# Patient Record
Sex: Male | Born: 1960 | Race: White | Hispanic: No | Marital: Married | Smoking: Never smoker
Health system: Southern US, Community
[De-identification: ages and names within clinical notes are randomized; demographics above are authoritative.]

---

## 2004-06-01 ENCOUNTER — Ambulatory Visit (HOSPITAL_COMMUNITY): Admission: RE | Admit: 2004-06-01 | Discharge: 2004-06-01 | Payer: Self-pay | Admitting: Gastroenterology

## 2013-02-27 ENCOUNTER — Ambulatory Visit: Payer: Self-pay | Admitting: Sports Medicine

## 2013-04-23 ENCOUNTER — Other Ambulatory Visit: Payer: Self-pay | Admitting: Orthopedic Surgery

## 2013-04-23 DIAGNOSIS — M8580 Other specified disorders of bone density and structure, unspecified site: Secondary | ICD-10-CM

## 2013-05-07 ENCOUNTER — Ambulatory Visit
Admission: RE | Admit: 2013-05-07 | Discharge: 2013-05-07 | Disposition: A | Discharge status: Home / Self Care | Payer: Managed Care, Other (non HMO) | Source: Ambulatory Visit | Attending: Orthopedic Surgery | Admitting: Orthopedic Surgery

## 2013-05-07 DIAGNOSIS — M8580 Other specified disorders of bone density and structure, unspecified site: Secondary | ICD-10-CM

## 2016-11-11 ENCOUNTER — Encounter: Payer: Self-pay | Admitting: Family Medicine

## 2016-11-11 ENCOUNTER — Ambulatory Visit (INDEPENDENT_AMBULATORY_CARE_PROVIDER_SITE_OTHER): Payer: BLUE CROSS/BLUE SHIELD | Admitting: Family Medicine

## 2016-11-11 DIAGNOSIS — M722 Plantar fascial fibromatosis: Secondary | ICD-10-CM

## 2016-11-11 NOTE — Patient Instructions (Signed)
You have plantar fasciitis Take tylenol or aleve as needed for pain  Plantar fascia stretch for 20-30 seconds (do 3 of these) in morning Lowering/raise on a step exercises 3 x 10 once or twice a day - this is very important for long term recovery. Can add heel walks, toe walks forward and backward as well Ice heel for 15 minutes as needed. Avoid flat shoes/barefoot walking as much as possible. Arch binders have been shown to help with pain. Spencos or our green insoles with scaphoid pads are what I would recommend to use as much as possible the next 6 weeks. Steroid injection is a consideration for short term pain relief if you are struggling. Physical therapy is also an option. Follow up with me in 6 weeks but can call me sooner if you want to try the injection, physical therapy.

## 2016-11-14 DIAGNOSIS — M722 Plantar fascial fibromatosis: Secondary | ICD-10-CM | POA: Insufficient documentation

## 2016-11-14 NOTE — Progress Notes (Signed)
PCP: Lorenda Peck, MD  Subjective:   HPI: Patient is a 56 y.o. male here for bilateral heel pain.  Patient reports he's had about 4 weeks of plantar to posterior heel pain. Worse with running on treadmill. Pain worse on right side at 8/10 and sharp, more dull at 4/10 on left. Tried advil, ibuprofen, resting, heel pads with only mild relief. No skin changes, numbness.  No past medical history on file.  No current outpatient prescriptions on file prior to visit.   No current facility-administered medications on file prior to visit.     No past surgical history on file.  No Known Allergies  Social History   Social History  . Marital status: Married    Spouse name: N/A  . Number of children: N/A  . Years of education: N/A   Occupational History  . Not on file.   Social History Main Topics  . Smoking status: Never Smoker  . Smokeless tobacco: Never Used  . Alcohol use Not on file  . Drug use: Unknown  . Sexual activity: Not on file   Other Topics Concern  . Not on file   Social History Narrative  . No narrative on file    No family history on file.  Pulse 79   Ht  (1.854 m)   Wt 200 lb (90.7 kg)   BMI 26.39 kg/m   Review of Systems: See HPI above.     Objective:  Physical Exam:  Gen: NAD, comfortable in exam room  Bilateral feet/ankles: Mild pronation.  No gross deformity, swelling, ecchymoses FROM TTP plantar fascia bilaterally at insertion on calcaneus.  No other tenderness. Negative ant drawer and talar tilt.   Negative syndesmotic compression. Negative calcaneal squeeze. Thompsons test negative. NV intact distally.   Assessment & Plan:  1. Bilateral plantar fasciitis - worse on right.  Shown home exercises and stretches to do daily.  Arch binders, inserts reviewed also.  Icing, tylenol or motrin.  Consider physical therapy, injections if not improving.  f/u in 6 weeks.

## 2016-11-14 NOTE — Assessment & Plan Note (Signed)
worse on right.  Shown home exercises and stretches to do daily.  Arch binders, inserts reviewed also.  Icing, tylenol or motrin.  Consider physical therapy, injections if not improving.  f/u in 6 weeks.

## 2016-12-22 ENCOUNTER — Ambulatory Visit (INDEPENDENT_AMBULATORY_CARE_PROVIDER_SITE_OTHER): Payer: BLUE CROSS/BLUE SHIELD | Admitting: Family Medicine

## 2016-12-22 ENCOUNTER — Encounter: Payer: Self-pay | Admitting: Family Medicine

## 2016-12-22 DIAGNOSIS — M722 Plantar fascial fibromatosis: Secondary | ICD-10-CM

## 2016-12-22 NOTE — Patient Instructions (Signed)
You have plantar fasciitis Continue the stretches and step exercise. Can add heel walks, toe walks forward and backward as well Ice heel for 15 minutes as needed. Continue arch binders, green insoles as well. Steroid injection is a consideration for short term pain relief if you are struggling. Physical therapy is also an option. Follow up with me in 6 weeks. The inserts can be ordered from [email or web address] (small scaphoid pads, comforthotic insoles mens 10-11)

## 2016-12-23 ENCOUNTER — Ambulatory Visit: Payer: BLUE CROSS/BLUE SHIELD | Admitting: Family Medicine

## 2016-12-23 NOTE — Progress Notes (Signed)
PCP: Burton Apleyoberts, Ronald, MD  Subjective:   HPI: Patient is a 56 y.o. male here for bilateral heel pain.  4/6: Patient reports he's had about 4 weeks of plantar to posterior heel pain. Worse with running on treadmill. Pain worse on right side at 8/10 and sharp, more dull at 4/10 on left. Tried advil, ibuprofen, resting, heel pads with only mild relief. No skin changes, numbness.  5/17: Patient reports he has improved since last visit. About 70% better. Doing home exercises, wearing arch binders, sports insoles. Pain level 2/10 but can get up to 8/10 and sharp plantar heels. No skin changes, numbness.  No past medical history on file.  No current outpatient prescriptions on file prior to visit.   No current facility-administered medications on file prior to visit.     No past surgical history on file.  No Known Allergies  Social History   Social History  . Marital status: Married    Spouse name: N/A  . Number of children: N/A  . Years of education: N/A   Occupational History  . Not on file.   Social History Main Topics  . Smoking status: Never Smoker  . Smokeless tobacco: Never Used  . Alcohol use Not on file  . Drug use: Unknown  . Sexual activity: Not on file   Other Topics Concern  . Not on file   Social History Narrative  . No narrative on file    No family history on file.  BP (!) 145/90   Pulse 70   Ht 6\' 1"  (1.854 m)   Wt 200 lb (90.7 kg)   BMI 26.39 kg/m   Review of Systems: See HPI above.     Objective:  Physical Exam:  Gen: NAD, comfortable in exam room  Bilateral feet/ankles: Mild pronation.  No gross deformity, swelling, ecchymoses FROM Mild TTP plantar fascia bilaterally at insertion on calcaneus.  No other tenderness. Negative ant drawer and talar tilt.   Negative calcaneal squeeze. NV intact distally.   Assessment & Plan:  1. Bilateral plantar fasciitis - Clinically improving.  Continue with exercises, stretches, arch  binders, inserts.  Icing, tylenol or motrin if needed.  A couple new pairs of inserts provided today with scaphoid pads.  Consider physical therapy, injections if not improving.  f/u in 6 weeks.

## 2016-12-23 NOTE — Assessment & Plan Note (Signed)
Clinically improving.  Continue with exercises, stretches, arch binders, inserts.  Icing, tylenol or motrin if needed.  A couple new pairs of inserts provided today with scaphoid pads.  Consider physical therapy, injections if not improving.  f/u in 6 weeks.

## 2017-02-02 ENCOUNTER — Ambulatory Visit (INDEPENDENT_AMBULATORY_CARE_PROVIDER_SITE_OTHER): Payer: BLUE CROSS/BLUE SHIELD | Admitting: Family Medicine

## 2017-02-02 ENCOUNTER — Encounter: Payer: Self-pay | Admitting: Family Medicine

## 2017-02-02 DIAGNOSIS — M722 Plantar fascial fibromatosis: Secondary | ICD-10-CM | POA: Diagnosis not present

## 2017-02-02 NOTE — Progress Notes (Signed)
PCP: Burton Apleyoberts, Ronald, MD  Subjective:   HPI: Patient is a 56 y.o. male here for bilateral heel pain.  4/6: Patient reports he's had about 4 weeks of plantar to posterior heel pain. Worse with running on treadmill. Pain worse on right side at 8/10 and sharp, more dull at 4/10 on left. Tried advil, ibuprofen, resting, heel pads with only mild relief. No skin changes, numbness.  5/17: Patient reports he has improved since last visit. About 70% better. Doing home exercises, wearing arch binders, sports insoles. Pain level 2/10 but can get up to 8/10 and sharp plantar heels. No skin changes, numbness.  6/28: Patient reports continued slow improvement. Pain is 4/10 but can get up to 9/10 and sharp at times plantar heels. Worse with prolonged sitting then going to get up. Doing home exercises, stretches, wearing arch binder, using inserts. Tried night splints. No skin changes, numbness.  No past medical history on file.  No current outpatient prescriptions on file prior to visit.   No current facility-administered medications on file prior to visit.     No past surgical history on file.  No Known Allergies  Social History   Social History  . Marital status: Married    Spouse name: N/A  . Number of children: N/A  . Years of education: N/A   Occupational History  . Not on file.   Social History Main Topics  . Smoking status: Never Smoker  . Smokeless tobacco: Never Used  . Alcohol use Not on file  . Drug use: Unknown  . Sexual activity: Not on file   Other Topics Concern  . Not on file   Social History Narrative  . No narrative on file    No family history on file.  BP 126/81   Pulse 76   Ht 6\' 1"  (1.854 m)   Wt 200 lb (90.7 kg)   BMI 26.39 kg/m   Review of Systems: See HPI above.     Objective:  Physical Exam:  Gen: NAD, comfortable in exam room  Bilateral feet/ankles: Mild pronation.  No gross deformity, swelling, ecchymoses FROM Mild TTP  plantar fascia bilaterally at insertion on calcaneus.  No other tenderness. Negative ant drawer and talar tilt.   Negative calcaneal squeeze. NV intact distally.   Assessment & Plan:  1. Bilateral plantar fasciitis - Clinically improving slowly.  Continue with exercises, stretches, arch binders, inserts.  Gave names of massage therapist, chiropractor for active release.  Icing, tylenol or motrin if needed.  Consider physical therapy, injections, custom orthotics if not improving.  F/u prn.

## 2017-02-02 NOTE — Patient Instructions (Signed)
You have plantar fasciitis Continue the stretches and step exercise. Ice heel for 15 minutes as needed. Continue arch binders, green insoles as well. Consider injection, physical therapy, custom orthotics if not improving. Massage Waynetta Pean(Kay Warren [phone]) or Active release Thereasa Distance(Jeremy Phillips at [facility] [phone]) are considerations Follow up with me as needed if you're doing well - call me if you want to do any of the above.

## 2017-02-02 NOTE — Assessment & Plan Note (Signed)
Clinically improving slowly.  Continue with exercises, stretches, arch binders, inserts.  Gave names of massage therapist, chiropractor for active release.  Icing, tylenol or motrin if needed.  Consider physical therapy, injections, custom orthotics if not improving.  F/u prn.

## 2019-10-24 ENCOUNTER — Ambulatory Visit: Payer: BC Managed Care – PPO | Attending: Internal Medicine

## 2019-10-24 DIAGNOSIS — Z23 Encounter for immunization: Secondary | ICD-10-CM

## 2019-10-24 NOTE — Progress Notes (Signed)
   Covid-19 Vaccination Clinic  Name:  Jerry Savage    MRN: [id number] DOB: August 30, 1960  10/24/2019  [name] was observed post Covid-19 immunization for 15 minutes without incident. He was provided with Vaccine Information Sheet and instruction to access the V-Safe system.   [name] was instructed to call 911 with any severe reactions post vaccine: [name] Kitchen Difficulty breathing  . Swelling of face and throat  . A fast heartbeat  . A bad rash all over body  . Dizziness and weakness   Immunizations Administered    Name Date Dose VIS Date Route   Pfizer COVID-19 Vaccine 10/24/2019  8:33 AM 0.3 mL 07/19/2019 Intramuscular   Manufacturer: [employer], [employer]   Lot: [id number]   NDC: [address]-3

## 2019-11-18 ENCOUNTER — Ambulatory Visit: Payer: BC Managed Care – PPO | Attending: Internal Medicine

## 2019-11-18 DIAGNOSIS — Z23 Encounter for immunization: Secondary | ICD-10-CM

## 2019-11-18 NOTE — Progress Notes (Signed)
   Covid-19 Vaccination Clinic  Name:  Jerry Savage    MRN: [id number] DOB: June 02, 1961  11/18/2019  [name] was observed post Covid-19 immunization for 15 minutes without incident. He was provided with Vaccine Information Sheet and instruction to access the V-Safe system.   [name] was instructed to call 911 with any severe reactions post vaccine: [name] Kitchen Difficulty breathing  . Swelling of face and throat  . A fast heartbeat  . A bad rash all over body  . Dizziness and weakness   Immunizations Administered    Name Date Dose VIS Date Route   Pfizer COVID-19 Vaccine 11/18/2019  8:06 AM 0.3 mL 07/19/2019 Intramuscular   Manufacturer: [employer], [employer]   Lot: [id number]   NDC: [address]-6

## 2020-09-22 ENCOUNTER — Other Ambulatory Visit: Payer: Self-pay | Admitting: Internal Medicine

## 2020-09-22 DIAGNOSIS — R7989 Other specified abnormal findings of blood chemistry: Secondary | ICD-10-CM

## 2020-09-25 ENCOUNTER — Ambulatory Visit
Admission: RE | Admit: 2020-09-25 | Discharge: 2020-09-25 | Disposition: A | Discharge status: Home / Self Care | Payer: BC Managed Care – PPO | Source: Ambulatory Visit | Attending: Internal Medicine | Admitting: Internal Medicine

## 2020-09-25 DIAGNOSIS — R7989 Other specified abnormal findings of blood chemistry: Secondary | ICD-10-CM

## 2021-03-02 ENCOUNTER — Other Ambulatory Visit: Payer: Self-pay

## 2021-03-02 ENCOUNTER — Encounter: Payer: Self-pay | Admitting: Podiatry

## 2021-03-02 ENCOUNTER — Ambulatory Visit: Payer: BC Managed Care – PPO | Admitting: Podiatry

## 2021-03-02 DIAGNOSIS — S93692A Other sprain of left foot, initial encounter: Secondary | ICD-10-CM | POA: Diagnosis not present

## 2021-03-02 DIAGNOSIS — S93691A Other sprain of right foot, initial encounter: Secondary | ICD-10-CM | POA: Diagnosis not present

## 2021-03-02 MED ORDER — MELOXICAM 15 MG PO TABS: 15.0000 mg | ORAL_TABLET | Freq: Every day | ORAL | 0 refills | Status: AC

## 2021-03-05 NOTE — Progress Notes (Signed)
Subjective:   Patient ID: Jerry Savage, male   DOB: 60 y.o.   MRN: [id number]   HPI 60 year old male presents the office today with concerns of possible tear to the plantar fascia with the left side worse than the right.  He states that the left side happened around July 7 when he missed a step he was carrying 50 pounds.  The right side occurred on June 30 after injury on a sharp corner of wood.  On the right is more along the heel of the left side well in the arch of the foot.  No recent treatment.  He states that any putting pressure in the arch of the foot causes discomfort.   Review of Systems  All other systems reviewed and are negative.  History reviewed. No pertinent past medical history.  History reviewed. No pertinent surgical history.   Current Outpatient Medications:    meloxicam (MOBIC) 15 MG tablet, Take 1 tablet (15 mg total) by mouth daily., Disp: 30 tablet, Rfl: 0  No Known Allergies        Objective:  Physical Exam  General: AAO x3, NAD  Dermatological: Skin is warm, dry and supple bilateral.  There are no open sores, no preulcerative lesions, no rash or signs of infection present.  Vascular: Dorsalis Pedis artery and Posterior Tibial artery pedal pulses are 2/4 bilateral with immedate capillary fill time. There is no pain with calf compression, swelling, warmth, erythema.   Neruologic: Grossly intact via light touch bilateral.  Negative Tinel sign.  Musculoskeletal: On the right side there is tenderness along plantar aspect calcaneus and insertion of plantar fascia.  Trace edema.  No erythema.  No pain about across the calcaneus.  On the left side majority tenderness is more along the arch of the foot just on the midfoot on the plantar aspect.  There is localized edema but there is no erythema.  No area of pinpoint tenderness.  Muscular strength 5/5 in all groups tested bilateral.  Gait: Unassisted, Nonantalgic.       Assessment:   Likely plantar fascial  tears bilaterally     Plan:  -Treatment options discussed including all alternatives, risks, and complications -Etiology of symptoms were discussed -Patient declined x-rays today.  Requested MRI and I agree with this.  Order MRI of the left foot and ankle given the location of the concern for the plantar fascial rupture as well as the right ankle as it is more proximal in the calcaneus.  Prescribed meloxicam discussed side effects.  Offered bracing, boot therapy when he wants to hold off on this for right now the son is getting married out of town this weekend.  Await MRI.  Vivi Barrack DPM

## 2021-03-08 ENCOUNTER — Telehealth: Payer: Self-pay | Admitting: *Deleted

## 2021-03-08 NOTE — Telephone Encounter (Signed)
-----   Message from Vivi Barrack, DPM sent at 03/05/2021  1:08 PM EDT ----- I am not sure if I already sent this to you but can you follow-up on the MRIs? Thanks.

## 2021-03-08 NOTE — Telephone Encounter (Signed)
MRI scheduled on 03-13-2021. Jerry Savage

## 2021-03-13 ENCOUNTER — Ambulatory Visit
Admission: RE | Admit: 2021-03-13 | Discharge: 2021-03-13 | Disposition: A | Discharge status: Home / Self Care | Payer: BC Managed Care – PPO | Source: Ambulatory Visit | Attending: Podiatry | Admitting: Podiatry

## 2021-03-13 ENCOUNTER — Other Ambulatory Visit: Payer: Self-pay

## 2021-03-13 DIAGNOSIS — S93692A Other sprain of left foot, initial encounter: Secondary | ICD-10-CM

## 2021-03-15 ENCOUNTER — Other Ambulatory Visit: Payer: Self-pay

## 2021-03-15 ENCOUNTER — Ambulatory Visit
Admission: RE | Admit: 2021-03-15 | Discharge: 2021-03-15 | Disposition: A | Discharge status: Home / Self Care | Payer: BC Managed Care – PPO | Source: Ambulatory Visit | Attending: Podiatry | Admitting: Podiatry

## 2021-03-15 DIAGNOSIS — S93691A Other sprain of right foot, initial encounter: Secondary | ICD-10-CM

## 2021-03-18 ENCOUNTER — Ambulatory Visit: Payer: BC Managed Care – PPO | Admitting: Podiatry

## 2021-03-18 ENCOUNTER — Other Ambulatory Visit: Payer: Self-pay

## 2021-03-18 DIAGNOSIS — S96912A Strain of unspecified muscle and tendon at ankle and foot level, left foot, initial encounter: Secondary | ICD-10-CM | POA: Diagnosis not present

## 2021-03-23 NOTE — Progress Notes (Signed)
Patient presents to pick up a cam boot for his left foot.  I reviewed the MRI with him and I briefly discussed with him again today when he came in to get the boot.  No joint pains on the bottom of the heel.  No significant pain to the ankle tendons.  We will proceed with a period of immobilization I will see him back in about 2 weeks for further evaluation and discussion in regards to her treatment options.  Vivi Barrack DPM

## 2021-04-01 ENCOUNTER — Other Ambulatory Visit: Payer: Self-pay

## 2021-04-01 ENCOUNTER — Ambulatory Visit: Payer: BC Managed Care – PPO | Admitting: Podiatry

## 2021-04-01 DIAGNOSIS — S93692D Other sprain of left foot, subsequent encounter: Secondary | ICD-10-CM | POA: Diagnosis not present

## 2021-04-01 DIAGNOSIS — M722 Plantar fascial fibromatosis: Secondary | ICD-10-CM

## 2021-04-01 DIAGNOSIS — S96912A Strain of unspecified muscle and tendon at ankle and foot level, left foot, initial encounter: Secondary | ICD-10-CM | POA: Diagnosis not present

## 2021-04-07 NOTE — Progress Notes (Signed)
Subjective: 60 year old male presents the office today for follow-up evaluation of an injury.  He is, the office to pick up a cam boot for the left side which has been wearing.  States he is doing okay it is less painful with wearing the boot.  He is unable to tell the swelling to go down the left side.  His pain is to the plantar aspect of the midfoot on the left side.  Still some discomfort in the right side as well.  No pain to the ankle or swelling to the ankle either.  Objective: AAO x3, NAD DP/PT pulses palpable bilaterally, CRT less than 3 seconds On the left plantar midfoot there is discomfort on the arch of the foot and so some mild swelling present but appears to be somewhat improved.  There is no erythema or warmth.  Left side there is no pain or swelling along the course the peroneal tendons.  Strength appears to be adequate to the tendons.  There is still some discomfort the plantar aspect of his insertional plantar fashion the right side.  No pain with compression of calcaneus.  No other areas of discomfort. No pain with calf compression, swelling, warmth, erythema  Assessment: 60 year old male with concern for plantar fascial rupture left side  Plan: -All treatment options discussed with the patient including all alternatives, risks, complications.  -I reviewed the MRI.  Still concern about possible plantar fascial rupture on the left side.  Would recommend to remain in the surgical boot.  I reviewed the MRI with him.  He has no pain along the peroneal tendons and this could be from an old injury. -For the left side continue with supportive shoe gear.  Discussed stretching, icing daily. -Patient encouraged to call the office with any questions, concerns, change in symptoms.   Vivi Barrack DPM

## 2021-04-27 ENCOUNTER — Ambulatory Visit: Payer: BC Managed Care – PPO | Admitting: Podiatry

## 2021-05-04 ENCOUNTER — Other Ambulatory Visit: Payer: Self-pay

## 2021-05-04 ENCOUNTER — Ambulatory Visit: Payer: BC Managed Care – PPO | Admitting: Podiatry

## 2021-05-04 DIAGNOSIS — M722 Plantar fascial fibromatosis: Secondary | ICD-10-CM | POA: Diagnosis not present

## 2021-05-04 DIAGNOSIS — S93692D Other sprain of left foot, subsequent encounter: Secondary | ICD-10-CM

## 2021-05-04 NOTE — Patient Instructions (Signed)

## 2021-05-07 NOTE — Progress Notes (Signed)
Subjective: 60 year old male presents the office today for follow-up evaluation of an injury.  He states he has been doing better and is back to wearing shoe recently but still in some swelling and discomfort.  Seems to move into the toes.  He is not as active as he was as well.  No new injury.  Objective: AAO x3, NAD DP/PT pulses palpable bilaterally, CRT less than 3 seconds Plantar aspect left midfoot there is still some mild swelling present but appears to be improving.  Mild discomfort still present at this area.  No pain to the dorsal metatarsals, rear foot or ankle.  Does get some subjective discomfort to the toes but no specific area of pinpoint tenderness.  There is no pain on course the peroneal tendons. No pain with calf compression, swelling, warmth, erythema  Assessment: 60 year old male with concern for plantar fascial rupture left side  Plan: -All treatment options discussed with the patient including all alternatives, risks, complications.  -I discussed with him different stretching, rehab exercises.  Icing to the foot daily.  He is back to his shoe and discussed shoes with support.  Return if symptoms worsen or fail to improve.  Vivi Barrack DPM

## 2022-10-25 IMAGING — MR MR FOOT*L* W/O CM
5 of 6 series · 27 of 40 positions shown · non-contrast
Comparison: None.

CLINICAL DATA: Left foot and ankle pain. Stepped on a step wrong
February 11, 2021.

EXAM:
MRI OF THE LEFT FOOT WITHOUT CONTRAST
MRI OF THE LEFT ANKLE WITHOUT CONTRAST
TECHNIQUE: Multiplanar, multisequence MR imaging of the left foot was
performed. No intravenous contrast was administered.
Multiplanar, multisequence MR imaging of the left ankle was

[Series 4: T1 · coronal · 4.0mm · 0.47mm/px · 8 of 40 slices shown (1 of 3)]
[im 1/40]
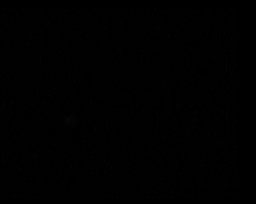
[im 6/40]
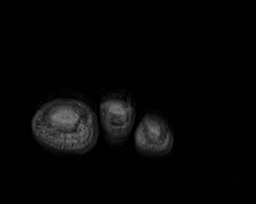
[im 12/40]
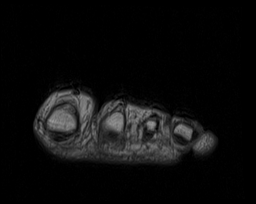
[im 17/40]
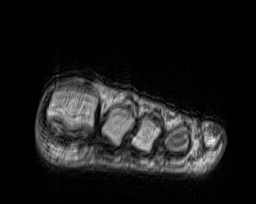
[im 23/40]
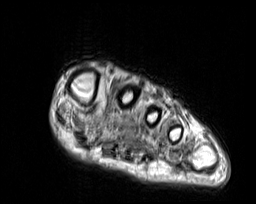
[im 28/40]
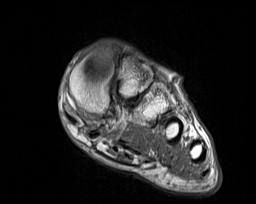
[im 34/40]
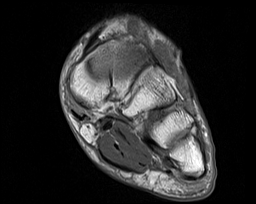
[im 40/40]
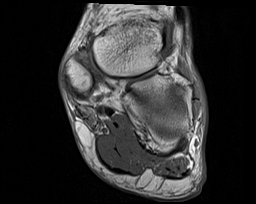

[Series 5: T2 fat-sat · coronal · 4.0mm · 0.23mm/px · 8 of 41 slices shown (1 of 2)]
[im 1/41]
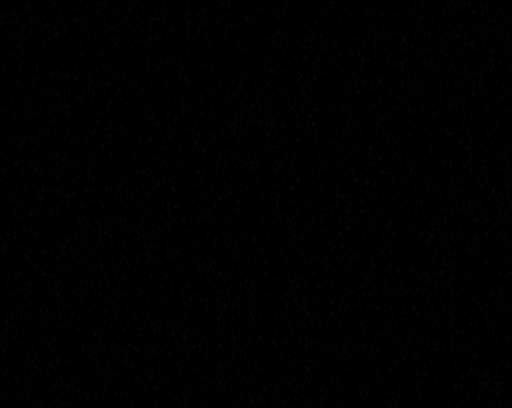
[im 6/41]
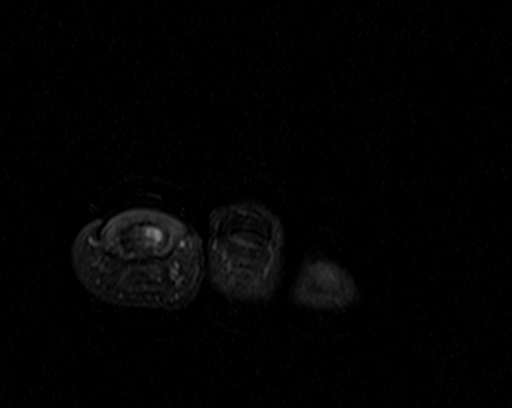
[im 12/41]
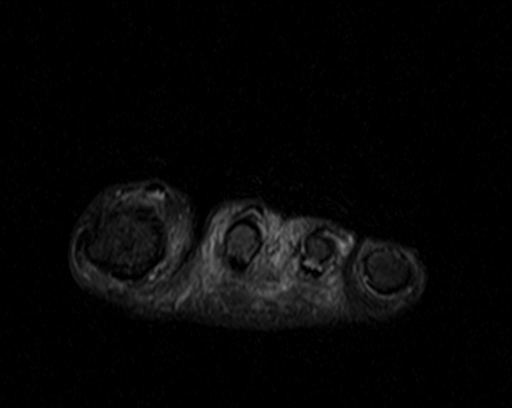
[im 18/41]
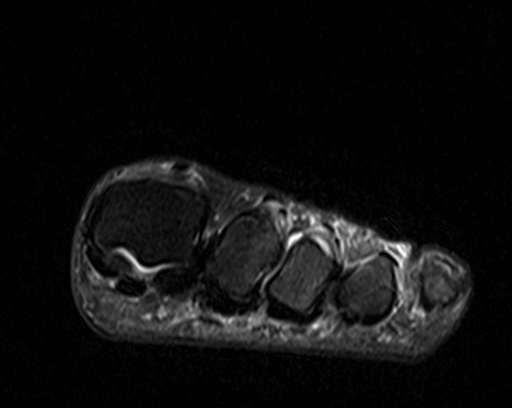
[im 23/41]
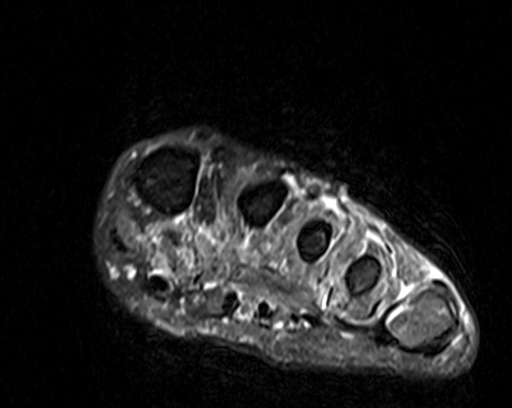
[im 29/41]
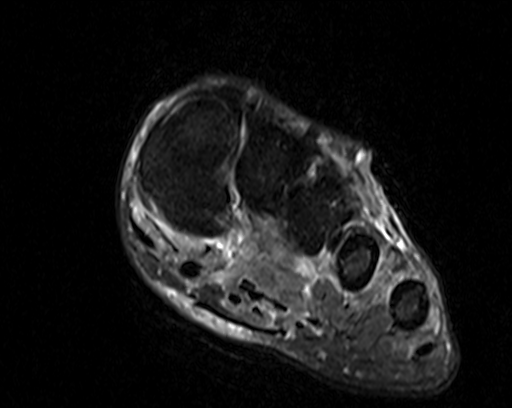
[im 35/41]
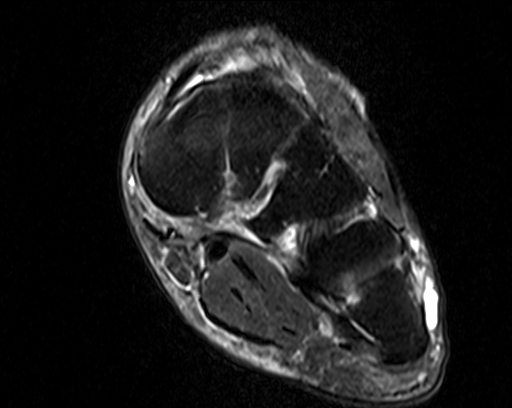
[im 41/41]
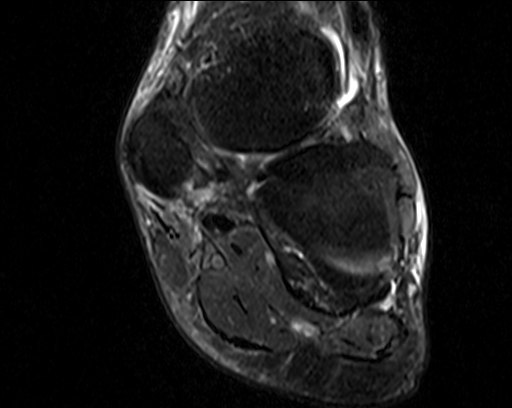

[Series 6: T2 fat-sat · axial · 3.0mm · 0.35mm/px · z∈[-91,-18]mm · 5 of 25 slices shown (2 of 2)]
[im 1/25]
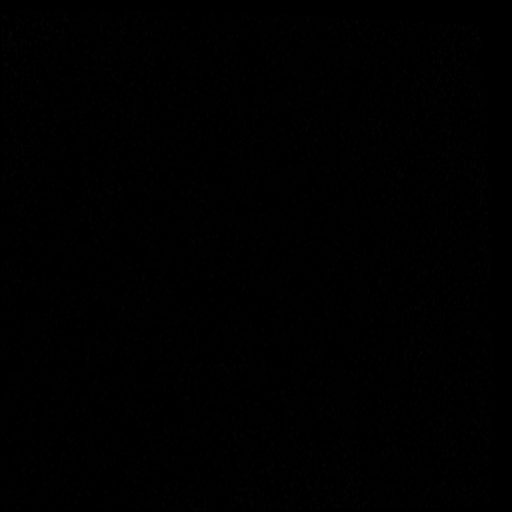
[im 7/25]
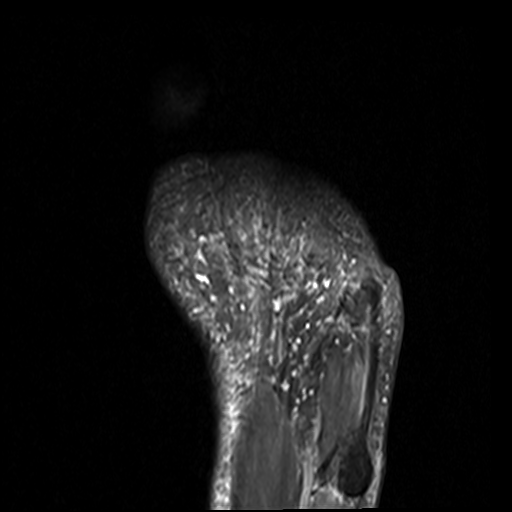
[im 13/25]
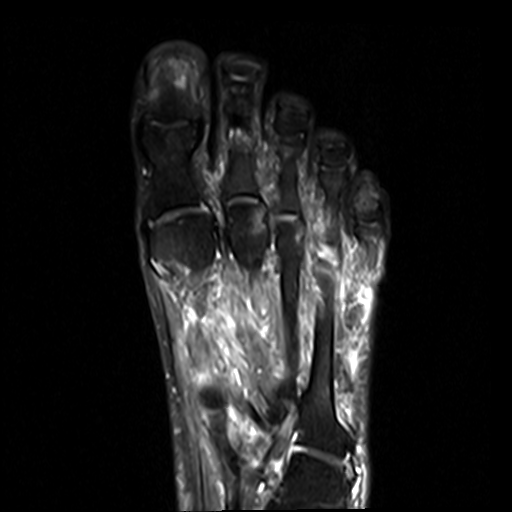
[im 19/25]
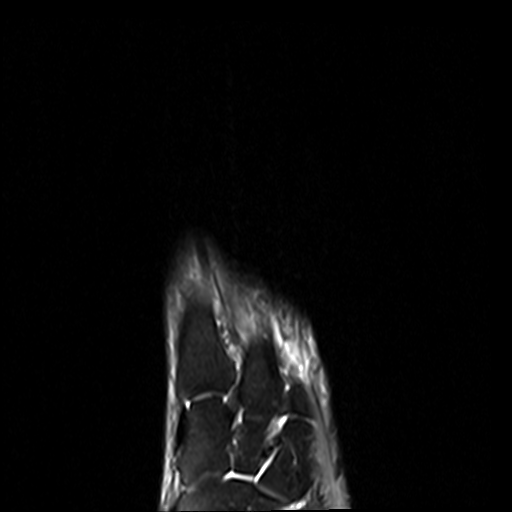
[im 25/25]
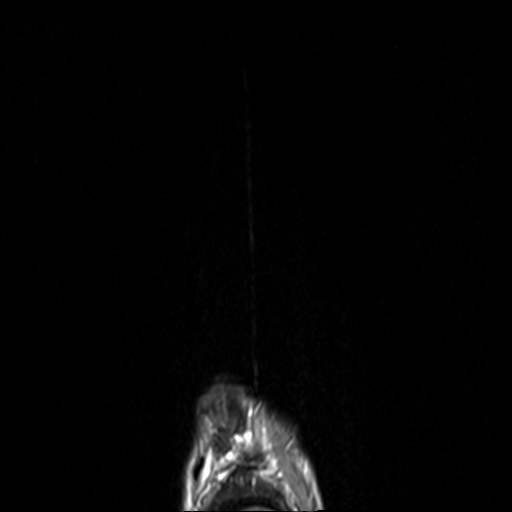

[Series 7: T1 · axial · 3.0mm · 0.35mm/px · z∈[-91,-18]mm · 5 of 25 slices shown (2 of 3)]
[im 1/25]
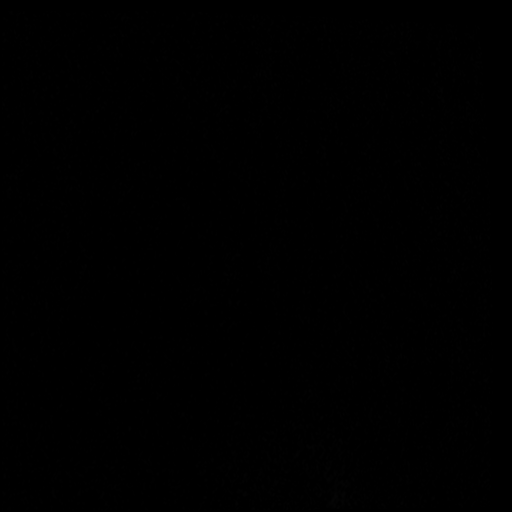
[im 7/25]
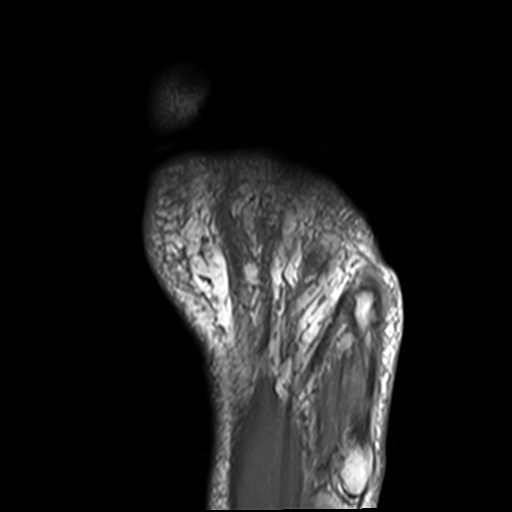
[im 13/25]
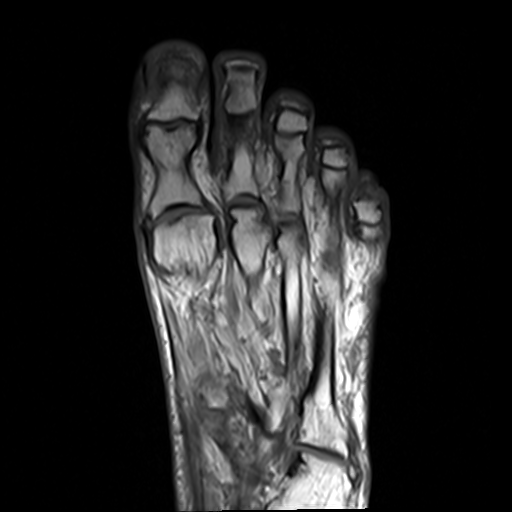
[im 19/25]
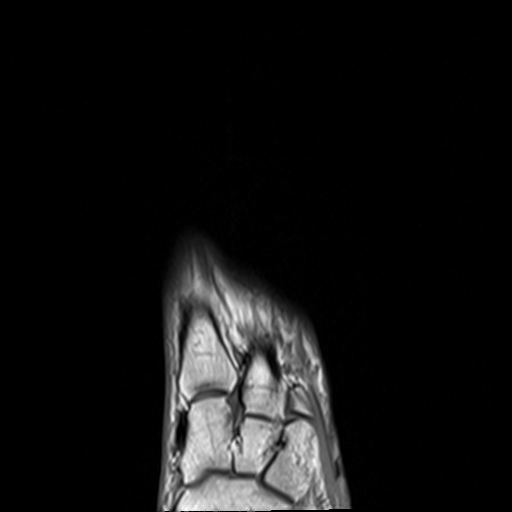
[im 25/25]
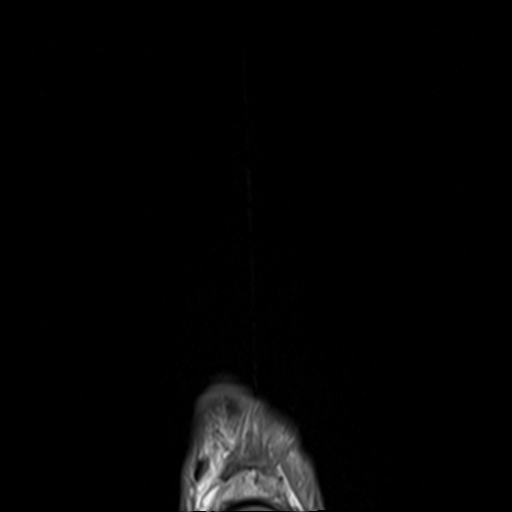

[Series 9: T1 · coronal · 4.0mm · 0.47mm/px · 1 of 40 slices shown (3 of 3)]
[im 1/40]
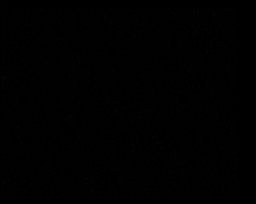

[27 of 40 positions shown; findings below may reference images not displayed]

FINDINGS: TENDONS

Peroneal: Peroneal longus tendon intact. Peroneus brevis is severely
attenuated distal to the lateral malleolus concerning for a
high-grade partial versus complete tear. Mild soft tissue edema
along the lateral aspect of the hindfoot.

Posteromedial: Posterior tibial tendon intact. Flexor hallucis
longus tendon intact. Flexor digitorum longus tendon intact.

Anterior: Tibialis anterior tendon intact. Extensor hallucis longus
tendon intact Extensor digitorum longus tendon intact.

Achilles:  Intact. Edema in Kager's fat.

Plantar Fascia: Intact.

LIGAMENTS

Lateral: Anterior talofibular ligament intact. Calcaneofibular
ligament intact. Posterior talofibular ligament intact. Anterior and
posterior tibiofibular ligaments intact.

Medial: Deltoid ligament intact. Spring ligament intact.

Lisfranc: Intact

Collaterals: Intact

CARTILAGE

Ankle Joint: No joint effusion. Normal ankle mortise. No chondral
defect.

Subtalar Joints/Sinus Tarsi: Normal subtalar joints. No subtalar
joint effusion. Normal sinus tarsi.

Bones: Mild bone marrow edema in the proximal shaft of the third and
fourth metatarsals and possibly the fifth metatarsal concerning for
stress reaction or contusion without a fracture. No acute fracture
or dislocation. Mild osteoarthritis of the talonavicular joint.

Soft Tissue: No fluid collection or hematoma. Muscles are normal
without edema or atrophy. Tarsal tunnel is normal.
IMPRESSION: 1. Peroneus brevis is severely attenuated distal to the lateral
malleolus concerning for a high-grade partial versus complete tear.
2. Mild bone marrow edema in the proximal shaft of the third and
fourth metatarsals and possibly the fifth metatarsal concerning for
stress reaction or contusion without a fracture.
3. Intact plantar fascia without a tear.
